# Patient Record
Sex: Male | Born: 1965 | Race: White | Hispanic: No | Marital: Married | Smoking: Never smoker
Health system: Southern US, Community
[De-identification: ages and names within clinical notes are randomized; demographics above are authoritative.]

## PROBLEM LIST (undated history)

## (undated) HISTORY — PX: TOTAL HIP ARTHROPLASTY: SHX124

---

## 2000-01-16 ENCOUNTER — Ambulatory Visit (HOSPITAL_COMMUNITY): Admission: RE | Admit: 2000-01-16 | Discharge: 2000-01-16 | Payer: Self-pay | Admitting: Orthopedic Surgery

## 2000-01-16 ENCOUNTER — Encounter: Payer: Self-pay | Admitting: Orthopedic Surgery

## 2000-06-30 ENCOUNTER — Encounter: Payer: Self-pay | Admitting: Orthopedic Surgery

## 2000-06-30 ENCOUNTER — Ambulatory Visit (HOSPITAL_COMMUNITY): Admission: RE | Admit: 2000-06-30 | Discharge: 2000-06-30 | Payer: Self-pay | Admitting: Orthopedic Surgery

## 2003-09-24 ENCOUNTER — Inpatient Hospital Stay (HOSPITAL_COMMUNITY): Admission: RE | Admit: 2003-09-24 | Discharge: 2003-09-27 | Payer: Self-pay | Admitting: Orthopedic Surgery

## 2008-06-08 ENCOUNTER — Encounter (INDEPENDENT_AMBULATORY_CARE_PROVIDER_SITE_OTHER): Payer: Self-pay | Admitting: *Deleted

## 2008-07-06 ENCOUNTER — Ambulatory Visit: Payer: Self-pay | Admitting: Internal Medicine

## 2008-07-06 DIAGNOSIS — M1990 Unspecified osteoarthritis, unspecified site: Secondary | ICD-10-CM | POA: Insufficient documentation

## 2008-07-13 ENCOUNTER — Ambulatory Visit: Payer: Self-pay | Admitting: Internal Medicine

## 2008-07-18 ENCOUNTER — Encounter (INDEPENDENT_AMBULATORY_CARE_PROVIDER_SITE_OTHER): Payer: Self-pay | Admitting: *Deleted

## 2008-07-18 LAB — CONVERTED CEMR LAB
AST: 21 units/L (ref 0–37)
BUN: 9 mg/dL (ref 6–23)
Basophils Relative: 1 % (ref 0.0–3.0)
CO2: 33 meq/L — ABNORMAL HIGH (ref 19–32)
CRP, High Sensitivity: 7 — ABNORMAL HIGH (ref 0.00–5.00)
Chloride: 102 meq/L (ref 96–112)
Creatinine, Ser: 0.8 mg/dL (ref 0.4–1.5)
Eosinophils Relative: 2.4 % (ref 0.0–5.0)
LDL Cholesterol: 120 mg/dL — ABNORMAL HIGH (ref 0–99)
Lymphocytes Relative: 44.2 % (ref 12.0–46.0)
Neutrophils Relative %: 44.9 % (ref 43.0–77.0)
RBC: 4.6 M/uL (ref 4.22–5.81)
TSH: 1.62 microintl units/mL (ref 0.35–5.50)
Total CHOL/HDL Ratio: 5.3
VLDL: 22 mg/dL (ref 0–40)
WBC: 5.1 10*3/uL (ref 4.5–10.5)

## 2008-07-20 ENCOUNTER — Telehealth (INDEPENDENT_AMBULATORY_CARE_PROVIDER_SITE_OTHER): Payer: Self-pay | Admitting: *Deleted

## 2008-09-05 ENCOUNTER — Inpatient Hospital Stay (HOSPITAL_COMMUNITY): Admission: RE | Admit: 2008-09-05 | Discharge: 2008-09-08 | Payer: Self-pay | Admitting: Orthopedic Surgery

## 2009-05-13 ENCOUNTER — Telehealth: Payer: Self-pay | Admitting: Internal Medicine

## 2009-05-30 ENCOUNTER — Encounter: Payer: Self-pay | Admitting: Internal Medicine

## 2009-05-31 ENCOUNTER — Encounter: Payer: Self-pay | Admitting: Internal Medicine

## 2009-05-31 LAB — CONVERTED CEMR LAB
BUN: 14 mg/dL
Chloride, Serum: 104 mmol/L
Cholesterol: 193 mg/dL
Creatinine, Ser: 0.9 mg/dL
Glucose, Bld: 93 mg/dL
HCT: 41.5 %
HDL: 45 mg/dL
LDL Cholesterol: 130 mg/dL
Platelets: 248 10*3/uL
Testosterone, total: 311 ng/mL
Total Bilirubin: 1.6 mg/dL
Total Protein: 7 g/dL
Triglycerides: 54 mg/dL
WBC, blood: 4.5 10*3/uL

## 2011-02-10 NOTE — Op Note (Signed)
[name], [name] NO.:  [id number]   MEDICAL RECORD NO.:  [id number]          PATIENT TYPE:  INP   LOCATION:  0001                         FACILITY:  [facility]   PHYSICIAN:  Ollen Gross, M.D.    DATE OF BIRTH:  [birth year]   DATE OF PROCEDURE:  09/05/2008  DATE OF DISCHARGE:                               OPERATIVE REPORT   PREOPERATIVE DIAGNOSIS:  Osteoarthritis left hip.   POSTOPERATIVE DIAGNOSIS:  Osteoarthritis left hip.   PROCEDURE:  Left total hip arthroplasty.   SURGEON:  Ollen Gross, M.D.   ASSISTANT:  Avel Peace PA-C   ANESTHESIA:  Spinal.   ESTIMATED BLOOD LOSS:  500 mL.   DRAINS:  Hemovac times one.   COMPLICATIONS:  None.   CONDITION:  Stable to recovery room.   BRIEF CLINICAL NOTE:  Allen Cordova is a 45 year old male who has end-stage  arthritis of the left hip with progressively worsening pain and  dysfunction.  He had a previous successful right total hip arthroplasty  and presents now for left total hip arthroplasty.   PROCEDURE IN DETAIL:  After successful administration of spinal  anesthetic, the patient was placed in the right lateral decubitus  position with the left side up and held with the hip positioner.  The  left lower extremity was isolated from his perineum with plastic drapes  and prepped and draped in the usual sterile fashion.  Short  posterolateral incision is made with a 10 blade through subcutaneous  tissue to the level of fascia lata which was incised in line with the  skin incision.  Sciatic nerve was palpated and protected and short  external rotators isolated off the femur.  Capsulectomy is performed and  the hip was dislocated.  The center of femoral head is marked and a  trial prosthesis is placed such that the center of the trial head  corresponds to the center of his native femoral head.  Osteotomy lines  marked on the femoral neck and osteotomy made with an oscillating saw.  The femoral head is removed and  femur retracted anteriorly to gain  acetabular exposure.   Acetabular retractors were placed and labrum and osteophytes removed.  Reaming starts at 47 mm coursing in increments of 2 up to 53 mm.  Then a  54-mm pinnacle acetabular shell was placed in anatomic position and  transfixed with a single dome screw.  The apex hole eliminator is placed  and the permanent 36 mm neutral Ultamet metal liner was placed for metal-  on-metal hip replacement.   The femur was prepared with the canal finder and irrigation.  Axial  reaming is performed to 13.5 mm, proximal reaming to an 18 D and the  sleeve machined to a large.  A 18 D large trial sleeve is placed with 18  x 13 stem and a 36 plus 8 neck about 10 degrees beyond native  anteversion.  The 36.0 trial head is placed.  The hip was reduced with  outstanding stability.  There is full extension, full external rotation,  70 degrees flexion, 40 degrees adduction and  90 degrees internal  rotation and 90 degrees of flexion and 70 degrees of internal rotation.  By placing the left leg on top of the right, the left leg was a tiny bit  shorter so I went to +3 head which had better soft tissue tension and  more appropriate length.  The hip was then reduced,the trials removed.  Permanent 18 D large sleeve is placed, 18 x 13 stem, 36 plus 8 neck  about 10 degrees beyond native anteversion.  A 36 plus 3 head is placed  and the hip is reduced to the same stability parameters.  The wound was  copiously irrigated with saline solution and short rotators reattached  to the femur through drill holes.  Fascia lata was closed over Hemovac  drain with interrupted #1 Vicryl, subcu closed with #1 and #2-0 Vicryl  and subcuticular running 4-0 Monocryl.  A drain is hooked to suction,  the incision cleaned and dried and Steri-Strips and bulky sterile  dressing applied.  He is then placed into a knee immobilizer, awakened  and transported to recovery in stable  condition.      Ollen Gross, M.D.  Electronically Signed     FA/MEDQ  D:  09/05/2008  T:  09/05/2008  Job:  [id number]

## 2011-02-10 NOTE — H&P (Signed)
Allen Cordova, Allen Cordova NO.:  [id number]   MEDICAL RECORD NO.:  [id number]          PATIENT TYPE:  INP   LOCATION:  NA                           FACILITY:  [facility]   PHYSICIAN:  Ollen Gross, M.D.    DATE OF BIRTH:  09-Jun-1966   DATE OF ADMISSION:  09/05/2008  DATE OF DISCHARGE:                              HISTORY & PHYSICAL   CHIEF COMPLAINT:  Left hip pain.   HISTORY OF THE PRESENT ILLNESS:  The patient is a 45 year old male well  to Dr. Ollen Gross having previously undergone a right total hip back  in December of 2004 for end-stage arthritis.  He has done extremely well  with his right hip.  The left hip has progressively gotten worse over  the past couple of years.  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  He has reached a point where he would like to have the other  side done.  The risks and benefits were discussed.  The patient was  subsequently admitted to the hospital.   ALLERGIES:  No know drug allergies.   CURRENT MEDICATIONS:  Occasional Aleve. Multiple vitamins.   PAST MEDICAL HISTORY:  Past history of osteomyelitis in the right foot  secondary to nail puncture many years ago as a teenager.  Otherwise,  medical history is negative.   PAST SURGICAL HISTORY:  Right hip arthroscopy.  Incision and drainage of  the right foot due to osteomyelitis and recent right total hip in 2004.   SOCIAL HISTORY:  Married, denies the use of tobacco products or alcohol  products.  He is a Education officer, community here in town.   FAMILY HISTORY:  Father deceased with history of myocardial infarction  and emphysema.   REVIEW OF SYSTEMS:  GENERAL:  No fevers, chills or night sweats.  NEUROLOGIC:  No seizure, syncope or paralysis.  RESPIRATORY:  No shortness of breath, productive cough, or hemoptysis.  CARDIOVASCULAR:  No chest pain, angina, orthopnea.  GI:  No nausea, vomiting or diarrhea or  constipation.  GU:  No dysuria, hematuria or discharge.  MUSCULOSKELETAL:  Right hip.   PHYSICAL EXAMINATION:  VITALS SIGNS:  Pulse 64, respirations 12, blood  pressure 116/78.  GENERAL:  In general a 45 year old white male well-nourished, well-  developed in no acute distress, alert and cooperative, excellent  sensorium.  HEENT:  Normocephalic, atraumatic.  Pupils are equal, round and  reactive.  Extraocular movements are intact.  NECK:  Neck is supple.  CHEST:  Chest is clear.  Anterior and posterior chest walls are without  rhonchi, rales or wheezing.  HEART:  Heart has a regular rate and rhythm.  No murmurs.  S1, S2 noted.  ABDOMEN:  Soft, slightly round.  Bowel sounds present.  RECTAL, BREAST AND GENITALIA:  Not done and per the present illness.  EXTREMITIES:  Left hip flexion 90, internal rotation 10, external  rotation 20, abduction 20.   IMPRESSION:  Osteoarthritis, left hip.   PLAN:  The patient admitted  to [facility] to undergo a left  total hip replacement arthroplasty.  Surgery will be performed by Dr.  Ollen Gross.      [name], P.A.C.      Ollen Gross, M.D.  Electronically Signed    ALP/MEDQ  D:  09/03/2008  T:  09/03/2008  Job:  [id number]   cc:   Ollen Gross, M.D.  Fax: [phone]

## 2011-02-13 NOTE — H&P (Signed)
Allen Cordova, Allen Cordova                            ACCOUNT NO.:  [id number]   MEDICAL RECORD NO.:  [id number]                   PATIENT TYPE:  INP   LOCATION:  0464                                 FACILITY:  [facility]   PHYSICIAN:  Ollen Gross, M.D.                 DATE OF BIRTH:  08-08-1966   DATE OF ADMISSION:  09/24/2003  DATE OF DISCHARGE:                                HISTORY & PHYSICAL   CHIEF COMPLAINT:  Right hip pain.   HISTORY OF PRESENT ILLNESS:  The patient is a 45 year old who has been seen  by Dr. Lequita Halt for ongoing right hip pain.  He has previously undergone a  hip arthroscopy in the past, but unfortunately has had significantly  increasing right hip pain and dysfunction.  He is having problems with range  of motion, and it is also limiting what he can and cannot do.  He is a  Education officer, community, has a very practice, and it has started to interfere with his  lifestyle and his career.  He was seen in the office where x-rays show bone-  on-bone changes throughout the joint with some marginal osteophyte  formation.  It is felt he has reached the point where he could benefit from  undergoing hip replacement.  Risks and benefits of this procedure have been  discussed with the patient, and he elects to proceed with surgery.   ALLERGIES:  No known drug allergies.   CURRENT MEDICATIONS:  1. Aleve b.i.d.  2. Vitamin A, E, C, and B complex.   PAST MEDICAL HISTORY:  Negative, with the exception of osteomyelitis to the  right foot secondary to a nail puncture.   PAST SURGICAL HISTORY:  Right hip arthroscopy and I&D of the right foot at  age 71 for the osteomyelitis.   SOCIAL HISTORY:  Married, three children.  Denies history of tobacco  products or alcohol products.   FAMILY HISTORY:  Father deceased with a history of MI and emphysema.   REVIEW OF SYSTEMS:  GENERAL:  No fevers, chills, night sweats.  NEUROLOGIC:  No seizures, syncope, or paralysis.  RESPIRATORY:  No shortness of  breath,  productive cough.  He has a mild cold, which has been improving.  CARDIOVASCULAR:  No chest pain, angina, or orthopnea.  GI:  No nausea,  vomiting, diarrhea, or constipation.  GU:  No dysuria or hematuria or  discharge.  MUSCULOSKELETAL:  Pertinent of the right hip found in the  history of present illness.   PHYSICAL EXAMINATION:  VITAL SIGNS:  Pulse 64, respirations 12, blood  pressure 128/78.  GENERAL:  A 45 year old white male, well-nourished, well-  developed, muscular build, in no acute distress.  Alert, oriented, and  cooperative.  HEENT:  Normocephalic and atraumatic.  Pupils are round and  reactive  Oropharynx clear.  NECK:  Supple.  CHEST:  Clear, anterior and  posterior chest walls.  No  rhonchi, rales, or wheezing.  HEART:  Regular  rate and rhythm.  No murmurs.  ABDOMEN:  Soft, nontender.  Bowel sounds were  present.  RECTAL/BREAST/GENITALIA:  Not done.  Not pertinent to present  illness.  EXTREMITIES:  Confined to the right hip.  Flexion of 90 degrees.  He does ambulate with an antalgic gain.  No internal rotation.  Only 10  degrees of external rotation.  Abduction of about 30 degrees.   IMPRESSION:  Osteoarthritis of right hip.   PLAN:  The patient will be admitted to [facility] to undergo right  total hip arthroplasty.  Surgery will be performed by Dr. Ollen Gross.     Allen Cordova, P.A.              Ollen Gross, M.D.    ALP/MEDQ  D:  09/24/2003  T:  09/25/2003  Job:  [id number]

## 2011-02-13 NOTE — Op Note (Signed)
[place]  Patient:    KAMRYN, GAUTHIER                         MRN: [id number] Proc. Date: 06/30/00 Adm. Date:  [id number] Disc. Date: [id number] Attending:  Ollen Gross V                           Operative Report  PREOPERATIVE DIAGNOSIS: Right hip intractable pain with labral and chondral tears and defects.  POSTOPERATIVE DIAGNOSIS: Right hip intractable pain with labral and chondral tears and defects.  OPERATION/PROCEDURE: Right hip arthroscopy with labral and chondral debridement.  SURGEON: Trudee Grip, M.D.  ASSISTANT: Ralene Bathe, P.A.  ANESTHESIA: General.  ESTIMATED BLOOD LOSS: Minimal.  DRAINS: None.  COMPLICATIONS: None.  CONDITION: Stable to recovery room.  INDICATIONS FOR PROCEDURE: Allen Cordova is a 45 year old male with a several year history of progressively increasing right hip pain and mechanical symptoms. X-rays show focal degenerative changes superolaterally.  MRI showed a labral tear anterosuperior and anteroinferior.  He has had intractable pain and presents now for arthroscopic debridement.  DESCRIPTION OF PROCEDURE: With successful administration of general anesthesia the patient was placed in the left lateral decubitus position with the right side up and a peroneal post was placed, which was well padded, and not impinging on the scrotum.  The right foot was then placed in a traction boot and traction applied under fluoroscopic guidance.  Once the joint was distracted approximately 1 cm the traction was locked in that position.  The hip was then prepped and draped in the usual sterile fashion.  Two cannulated spinal needles were then placed just anterior and posterior to the tip of the greater trochanter.  They were shown to enter the joint under fluoroscopic guidance.  The trocar was removed and the Nitonol wires were passed through the needles.  The needles were then taken out.  Incision was then  made posteriorly around the wire and then the 5 and then 7 mm dilator placed.  The 5 mm cannula was placed into the joint and then the camera placed.  Inflow was initiated and arthroscopic visualization proceeded.  There was some chondromalacia of the femoral head anteriorly but centrally and posteriorly looked good.  There was no evidence of any full-thickness chondral defect on the femoral head.  The trochlea appears normal.  Posteriorly there was some hypertrophy of the labrum with fraying and no evidence of any chondral defect of the acetabulum.  Centrally just above the trochlea there was grade 3 and 4 chondromalacia in a small focal area about 1 x 1 cm.  This was then shaved down to a stable base after the anterior portal was established.  Under arthroscopic guidance the dilators were placed over the anterior wire up to 7 mm and then a 5 mm cannula was placed.  The camera and shaver were switched and the posterior fraying of the labrum was debrided back to a stable base. The central defect was also debrided back to a stable cartilaginous base.  At this point the portals were again switched and the anterior aspect then visualized.  He had grade 4 changes on the anterior acetabulum in an area about 2 x 3 cm.  There was blistered cartilage and also very thin cartilage down to bone.  This was probed and found to be unstable.  The shaver was then used to debride it back to a  stable bony base.  The anterior/inferior and anterior central labrum were also torn and they were debrided.  Anterior/ superior had some fraying and that was debrided.  The bony base after debrided was about 2 x 3 cm as stated.  A Steinmann pin was used to perforate a portion of it to allow for bleeding and then the rest was abraded with the shaver.  At this point the femoral head was again inspected with only anterior chondromalacia but no full-thickness defects.  Arthroscopic equipment was removed from the posterior  portal, which was closed with Nylon.  Marcaine 0.50% 20 cc with epinephrine was injected through the inflow cannula and that was removed.  The other portal was then closed with Nylon.  Traction was removed and the patient then placed supine, awakened, and transported to the recovery room in stable condition. DD:  06/30/00 TD:  07/01/00 Job: [id number]

## 2011-02-13 NOTE — Discharge Summary (Signed)
Allen Cordova, Allen Cordova NO.:  [id number]   MEDICAL RECORD NO.:  [id number]          PATIENT TYPE:  INP   LOCATION:  1610                         FACILITY:  [facility]   PHYSICIAN:  Ollen Gross, M.D.    DATE OF BIRTH:  1966-05-30   DATE OF ADMISSION:  09/05/2008  DATE OF DISCHARGE:  09/08/2008                               DISCHARGE SUMMARY   ADMITTING DIAGNOSES:  1. Osteoarthritis, left hip.  2. Past history of osteomyelitis, right foot, secondary to nail      puncture many years ago.   DISCHARGE DIAGNOSES:  1. Osteoarthritis, left hip, status post left total replacement      arthroplasty.  2. Mild postop blood loss anemia, did not require transfusion.  3. Past history of osteomyelitis, right foot, secondary to nail      puncture many years ago.   PROCEDURE:  September 05, 2008, left total hip.   SURGEON:  Dr. Lequita Halt.   ASSISTANT:  Avel Peace, P.A.-C.   Spinal anesthesia.   CONSULTS:  None.   BRIEF HISTORY:  [name] is a 42-year male with end-stage arthritis of left  hip, progressive, worsening pain and dysfunction, successful right total  hip, now presents for left total hip.   LABORATORY DATA:  Preop CBC, hemoglobin of 14.5, hematocrit of 40.8,  white cell count 5.6, platelets 265, postop hemoglobin 11.5, down to  10.8, back up to 11.0 with a crit of 32.5.  PT/PTT preop 13.2 and 31  respectively.  INR 1.0.  Serial pro times followed.  Last noted PT/INR  22.0 and 1.8.  Chem panel on admission all within normal limits with the  exception of mildly elevated total bili of 1.3.  Serial BMETs were  followed.  Electrolytes remained within normal limits.  Glucose did go  up from 93 to 150, back down to 119.  Preop UA negative.  Blood group  type O-.   EKG, July 06, 2008, sinus rhythm, incomplete right bundle branch  block, confirmed by Dr. Drue Novel.   HOSPITAL COURSE:  Patient admitted to [facility], tolerated  procedure well, later transferred to  recovery room on orthopedic floor.  Started on PCA and p.o. analgesic pain control following surgery.  Given  24 hours postop IV antibiotics.  Started on Coumadin for DVT  prophylaxis.  Doing very well the morning of day 1, had excellent  urinary output.  Hemovac drain placed in by surgery was pulled on day 1  without difficulty.  Started getting up with therapy on day 1, walked  about 65 feet, doing extremely well by day 2.  Dressing was changed,  incision looked good.  Hemoglobin was stable at 10.8, no symptoms.  Continued to progress well with physical therapy and by day 3 met all  goals and was ready to go home.   DISCHARGE/PLAN:  1. Patient discharged home on September 08, 2008.  2. Discharge diagnoses:  Please see above.  3. Discharge meds:  Percocet, Robaxin, Coumadin.   DIET:  As tolerated.   FOLLOWUP:  Two weeks.   DISPOSITION:  Home.  CONDITION UPON DISCHARGE:  Improving.   ACTIVITY:  Partial weightbearing, 25% to 50%, to the left lower  extremity.  Hip precaution.  Total hip protocol.      [name], P.A.C.      Ollen Gross, M.D.  Electronically Signed    ALP/MEDQ  D:  10/11/2008  T:  10/11/2008  Job:  [id number]   cc:   Ollen Gross, M.D.  Fax: [phone]

## 2011-02-13 NOTE — Discharge Summary (Signed)
Allen Cordova, Allen Cordova                        ACCOUNT NO.:  [id number]   MEDICAL RECORD NO.:  [id number]                   PATIENT TYPE:  INP   LOCATION:  0464                                 FACILITY:  [facility]   PHYSICIAN:  Ollen Gross, M.D.                 DATE OF BIRTH:  [birth year]   DATE OF ADMISSION:  09/24/2003  DATE OF DISCHARGE:  09/27/2003                                 DISCHARGE SUMMARY   ADMISSION DIAGNOSIS:  Osteoarthritis right hip.   DISCHARGE DIAGNOSIS:  Osteoarthritis right hip status post right total hip  arthroplasty.   PROCEDURE:  Date of surgery September 24, 2003 right total hip arthroplasty.  Surgeon:  Ollen Gross, M.D.  Assistant:  [name], P.A.-C.  Anesthesia:  General.  Blood loss:  600 mL., Hemovac drain x1.   CONSULTATIONS:  None.   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  He has been refractory to nonoperative  management.  The pain has been interfering with his daily activities and  also his career.  He now presents for total hip arthroplasty.   LABORATORY DATA:  CBC on admission showed a hemoglobin of 13.9, hematocrit  of 40.6, white cell count 4.4, red cell count 4.5, a differential within  normal limits.  Postop H&H 11.4 and 33.2.  Last noted H&H 10.7 and 31.  PT/PTT on admission 12.8 and 31 respectively with an INR 0.9.  Serial pro  times followed.  Last noted PT/INR 19.3 and 2.  Chem panel on admission  slightly elevated glucose at 135.  Remaining chem panel all within normal  limits.  Follow up BMET sodium dropped from 140 down to 131 and is back up  to 138.  Glucose went from 135 to 114.  Calcium dropped from 9.2 to 8.2.  Urinalysis on admission preoperatively negative.  Blood group type O  negative.  Right hip films:  Right total hip prosthesis in place, no visible  fractures.  Date of x-ray September 24, 2003.   HOSPITAL COURSE:  The patient was admitted to [facility], taken to  the OR, and underwent the above-stated procedure without complication.  The  patient tolerated the procedure well, later transferred to recovery room,  and then to the orthopedic floor for continued postoperative care.  Given 24  hours of postop IV antibiotics, placed on Coumadin for 3 weeks.  PT and OT  were consulted postop to assist with gait training, ambulation, and ADLs.  He was placed touchdown weightbearing.  Labs were followed.  Hemovac placed  at the time of surgery was pulled on postop day #1 without difficulty.  He  was doing fairly well on the morning after surgery.  He did have a little  bit of congestion by the next day and we placed him on some Sudafed.  Started getting up with physical  therapy.  By day #2, PCA and IV's were  discontinued.  He was weaned over to p.o. meds.  Dressing change was  initiated and incision was healing well.  He progressed very well with  physical therapy and was actually doing a fair amount of ambulation by day  #2.  He had walked 150 feet twice by day #3.  He was doing much better.  Dr.  Lequita Halt saw him on rounds.  He was ready to go home.  Decided he could start  showering.   DISCHARGE MEDICATIONS AND PLAN:  1. The patient discharged home on September 27, 2003.  2. Discharge diagnosis please see above.  3. Discharge meds:  Percocet, Robaxin, and Coumadin.  4. Diet:  As tolerated.  5. Activity:  He may start showering, touchdown weightbearing, hip     precautions at all times, total hip protocol.   FOLLOW UP:  Two weeks from surgery.  Call the office for an appointment at  [phone].   DISPOSITION:  To home.   CONDITION ON DISCHARGE:  Improved.     Alexzandrew L. Julien Girt, P.A.              Ollen Gross, M.D.    ALP/MEDQ  D:  10/24/2003  T:  10/24/2003  Job:  [id number]

## 2011-02-13 NOTE — Op Note (Signed)
NAME:  Allen, Cordova                            ACCOUNT NO.:  [id number]   MEDICAL RECORD NO.:  [id number]                   PATIENT TYPE:  INP   LOCATION:  0002                                 FACILITY:  [facility]   PHYSICIAN:  Ollen Gross, M.D.                 DATE OF BIRTH:  1966-02-03   DATE OF PROCEDURE:  09/24/2003  DATE OF DISCHARGE:                                 OPERATIVE REPORT   PREOPERATIVE DIAGNOSIS:  Osteoarthritis, right hip.   POSTOPERATIVE DIAGNOSIS:  Osteoarthritis, right hip.   PROCEDURE:  Right total hip arthroplasty.   SURGEON:  Ollen Gross, M.D.   ASSISTANT:  Alexzandrew L. Julien Girt, P.A.   ANESTHESIA:  General.   ESTIMATED BLOOD LOSS:  600.   DRAINS:  Hemovac x1.   COMPLICATIONS:  None.   CONDITION:  Stable to recovery.   BRIEF CLINICAL NOTE:  Allen Cordova is a 45 year old male with severe end-stage  osteoarthritis of the right hip with pain refractory to nonoperative  management.  He presents now for right total hip arthroplasty.   PROCEDURE IN DETAIL:  After the successful administration of a general  anesthetic, the patient was placed in the left lateral decubitus position  with the right side up and held with the hip positioner.  The right lower  extremity was isolated from the perineum with plastic drapes and prepped and  draped in the usual sterile fashion.  A min-posterolateral incision was made  with a 10 blade through subcutaneous tissue to a level of the fascia lata,  which was incised in line with the skin incision.  The sciatic nerve was  palpated and protected and the short rotators isolated off the femur.  Capsulectomy was performed, hip dislocated, and the center of the femoral  head marked.  Trial prosthesis is placed such that the center of the trial  head corresponds to the center of his native femoral head.  Osteotomy lines  are marked on the femoral neck and osteotomy is made with an oscillating  saw.  The femur is then retracted  anteriorly to gain acetabular exposure.   The acetabular retractors are placed and osteophytes and labrum are removed.  Acetabular reaming starts at a 47, coursing in increments to a 55.  Then a  56 mm Pinnacle acetabular shell is placed in an anatomic position and  transfixed with two domed screws.  A trial 36 mm neutral liner is placed.   The femur is then prepared, first the canal finder and irrigation.  Axial  reaming is performed to 13.5 mm, proximal reaming to an 10F, and the sleeve  machined to a large.  An 10F large trial sleeve is placed and an 18 x 13  stem and a 36 standard neck.  With the standard I did not feel that there  was appropriate soft tissue tension, so I went to the 36 +8.  With the  36 +  8 he had full extension, full external rotation, 70 degrees flexion, 40  degrees adduction; in 90 degrees internal rotation, 90 degrees flexion, 70  degrees rotation.  I could also flex him up to approximately 120 without a  problem.  By resting his right leg on top of the left, his leg lengths were  equal.  The hip was then dislocated and all trials removed.   The permanent apex hole eliminator is placed into the acetabular shell, then  the 36 mm neutral metal Ultramet liner was placed.  This is a metal-on-metal  hip replacement.  The 74F large sleeve is then placed with an 18 x 13 stem  and a 36 +8 neck.  I went about 10 degrees beyond his native anteversion as  he was only about 5-10 degrees anteverted.  The 36 +0 head was placed.  The  hip is reduced with the same stability parameters.  The wound is copiously  irrigated with antibiotic solution and the short rotators reattached to the  femur through drill holes.  The fascia lata is closed over a Hemovac drain  with interrupted #1 Vicryl, subcu closed with #1 and 2-0 Vicryl, and  subcuticular running 4-0 Monocryl.  The incision is cleaned and dried and  Steri-Strips and a bulky sterile dressing is applied.  The drain is hooked   to suction.  He is placed into a knee immobilizer, awakened and transported  to recovery in stable condition.                                               Ollen Gross, M.D.    FA/MEDQ  D:  09/24/2003  T:  09/24/2003  Job:  [id number]

## 2011-07-03 LAB — BASIC METABOLIC PANEL
CO2: 28 mEq/L (ref 19–32)
CO2: 30 mEq/L (ref 19–32)
Calcium: 8.3 mg/dL — ABNORMAL LOW (ref 8.4–10.5)
Chloride: 101 mEq/L (ref 96–112)
GFR calc Af Amer: >60 mL/min (ref >60)
GFR calc Af Amer: >60 mL/min (ref >60)
GFR calc non Af Amer: >60 mL/min (ref >60)
Glucose, Bld: 150 mg/dL — ABNORMAL HIGH (ref 70–99)
Potassium: 3.6 mEq/L (ref 3.5–5.1)
Potassium: 4 mEq/L (ref 3.5–5.1)
Sodium: 136 mEq/L (ref 135–145)
Sodium: 139 mEq/L (ref 135–145)

## 2011-07-03 LAB — URINALYSIS, ROUTINE W REFLEX MICROSCOPIC
Glucose, UA: NEGATIVE mg/dL
Nitrite: NEGATIVE
Specific Gravity, Urine: 1.014 (ref 1.005–1.030)
pH: 5.5 (ref 5.0–8.0)

## 2011-07-03 LAB — CBC
HCT: 31.4 % — ABNORMAL LOW (ref 39.0–52.0)
HCT: 32.5 % — ABNORMAL LOW (ref 39.0–52.0)
HCT: 34 % — ABNORMAL LOW (ref 39.0–52.0)
HCT: 42.8 % (ref 39.0–52.0)
Hemoglobin: 10.8 g/dL — ABNORMAL LOW (ref 13.0–17.0)
Hemoglobin: 11 g/dL — ABNORMAL LOW (ref 13.0–17.0)
Hemoglobin: 11.5 g/dL — ABNORMAL LOW (ref 13.0–17.0)
Hemoglobin: 14.5 g/dL (ref 13.0–17.0)
MCHC: 34 g/dL (ref 30.0–36.0)
MCHC: 34.4 g/dL (ref 30.0–36.0)
MCV: 89.8 fL (ref 78.0–100.0)
MCV: 90.9 fL (ref 78.0–100.0)
RBC: 3.45 MIL/uL — ABNORMAL LOW (ref 4.22–5.81)
RBC: 3.61 MIL/uL — ABNORMAL LOW (ref 4.22–5.81)
RBC: 3.77 MIL/uL — ABNORMAL LOW (ref 4.22–5.81)
RBC: 4.77 MIL/uL (ref 4.22–5.81)
RDW: 13.3 % (ref 11.5–15.5)
RDW: 13.3 % (ref 11.5–15.5)
WBC: 5.6 10*3/uL (ref 4.0–10.5)
WBC: 7.9 10*3/uL (ref 4.0–10.5)

## 2011-07-03 LAB — PROTIME-INR
INR: 1 (ref 0.00–1.49)
INR: 1.2 (ref 0.00–1.49)
INR: 1.8 — ABNORMAL HIGH (ref 0.00–1.49)
Prothrombin Time: 13.2 seconds (ref 11.6–15.2)
Prothrombin Time: 22.6 seconds — ABNORMAL HIGH (ref 11.6–15.2)

## 2011-07-03 LAB — COMPREHENSIVE METABOLIC PANEL
ALT: 26 U/L (ref 0–53)
Alkaline Phosphatase: 64 U/L (ref 39–117)
BUN: 15 mg/dL (ref 6–23)
Chloride: 105 mEq/L (ref 96–112)
Glucose, Bld: 93 mg/dL (ref 70–99)
Potassium: 4.1 mEq/L (ref 3.5–5.1)
Sodium: 142 mEq/L (ref 135–145)
Total Bilirubin: 1.3 mg/dL — ABNORMAL HIGH (ref 0.3–1.2)

## 2011-07-03 LAB — TYPE AND SCREEN: Antibody Screen: NEGATIVE

## 2015-04-05 ENCOUNTER — Encounter: Payer: Self-pay | Admitting: Internal Medicine

## 2015-04-05 ENCOUNTER — Ambulatory Visit (INDEPENDENT_AMBULATORY_CARE_PROVIDER_SITE_OTHER): Payer: BLUE CROSS/BLUE SHIELD | Admitting: Internal Medicine

## 2015-04-05 DIAGNOSIS — Z23 Encounter for immunization: Secondary | ICD-10-CM

## 2015-04-05 DIAGNOSIS — Z789 Other specified health status: Secondary | ICD-10-CM

## 2015-04-05 DIAGNOSIS — Z7189 Other specified counseling: Secondary | ICD-10-CM

## 2015-04-05 DIAGNOSIS — Z7184 Encounter for health counseling related to travel: Secondary | ICD-10-CM

## 2015-04-05 MED ORDER — ACETAZOLAMIDE 125 MG PO TABS: 125.0000 mg | ORAL_TABLET | Freq: Two times a day (BID) | ORAL | Status: DC

## 2015-04-05 MED ORDER — CIPROFLOXACIN HCL 500 MG PO TABS: 500.0000 mg | ORAL_TABLET | Freq: Two times a day (BID) | ORAL | Status: DC

## 2015-04-05 MED ORDER — TYPHOID VACCINE PO CPDR: 1.0000 | DELAYED_RELEASE_CAPSULE | ORAL | Status: DC

## 2015-04-05 NOTE — Progress Notes (Signed)
Subjective:   Raphael GibneyJohn S Bueno is a 49 y.o. male who presents to the Infectious Disease clinic for travel consultation. Planned departure date: April 26, 2015          Planned return date: 7 days Countries of travel: [place] Areas in country: urban   Accommodations: hotel Purpose of travel: vacation Prior travel out of [place]: yes     Objective:   Medications:reviewed    Assessment:    No contraindications to travel. none     Plan:    Issues discussed: altitude illness, insect-borne illnesses, malaria, MVA safety, rabies, safe food/water, traveler's diarrhea, website/handouts for more information, what to do if ill upon return, what to do if ill while there and Yellow Fever. Immunizations recommended: Typhoid (oral).  Discussed hepatitis A and Td and he is going to check if needs or not.  Malaria prophylaxis: not indicated Traveler's diarrhea prophylaxis: ciprofloxacin. Total duration of visit: 1 Hour. Total time spent on education, counseling, coordination of care: 30 Minutes.

## 2015-04-10 ENCOUNTER — Ambulatory Visit: Payer: BLUE CROSS/BLUE SHIELD | Admitting: *Deleted

## 2015-04-10 DIAGNOSIS — Z23 Encounter for immunization: Secondary | ICD-10-CM

## 2015-04-10 NOTE — Patient Instructions (Signed)
Return in 6 months for Hep A #2.

## 2015-04-12 ENCOUNTER — Ambulatory Visit: Payer: BLUE CROSS/BLUE SHIELD

## 2019-10-26 ENCOUNTER — Ambulatory Visit: Payer: BC Managed Care – PPO | Attending: Internal Medicine

## 2019-10-27 ENCOUNTER — Ambulatory Visit: Payer: BC Managed Care – PPO | Attending: Internal Medicine

## 2019-10-27 DIAGNOSIS — Z20822 Contact with and (suspected) exposure to covid-19: Secondary | ICD-10-CM

## 2019-10-28 LAB — NOVEL CORONAVIRUS, NAA: SARS-CoV-2, NAA: NOT DETECTED

## 2021-02-13 ENCOUNTER — Encounter (HOSPITAL_COMMUNITY): Payer: Self-pay | Admitting: Emergency Medicine

## 2021-02-13 ENCOUNTER — Emergency Department (HOSPITAL_COMMUNITY): Payer: BC Managed Care – PPO

## 2021-02-13 ENCOUNTER — Emergency Department (HOSPITAL_COMMUNITY)
Admission: EM | Admit: 2021-02-13 | Discharge: 2021-02-13 | Disposition: A | Discharge status: Home / Self Care | Payer: BC Managed Care – PPO | Attending: Emergency Medicine | Admitting: Emergency Medicine

## 2021-02-13 DIAGNOSIS — S0993XA Unspecified injury of face, initial encounter: Secondary | ICD-10-CM | POA: Diagnosis present

## 2021-02-13 DIAGNOSIS — X58XXXA Exposure to other specified factors, initial encounter: Secondary | ICD-10-CM | POA: Diagnosis not present

## 2021-02-13 DIAGNOSIS — R569 Unspecified convulsions: Secondary | ICD-10-CM | POA: Diagnosis not present

## 2021-02-13 DIAGNOSIS — S0081XA Abrasion of other part of head, initial encounter: Secondary | ICD-10-CM | POA: Diagnosis not present

## 2021-02-13 LAB — COMPREHENSIVE METABOLIC PANEL
ALT: 29 U/L (ref 0–44)
AST: 41 U/L (ref 15–41)
Albumin: 3.5 g/dL (ref 3.5–5.0)
Alkaline Phosphatase: 45 U/L (ref 38–126)
Anion gap: 6 (ref 5–15)
BUN: 11 mg/dL (ref 6–20)
CO2: 29 mmol/L (ref 22–32)
Calcium: 8.8 mg/dL — ABNORMAL LOW (ref 8.9–10.3)
Chloride: 100 mmol/L (ref 98–111)
Creatinine, Ser: 1.13 mg/dL (ref 0.61–1.24)
GFR, Estimated: >60 mL/min (ref >60)
Glucose, Bld: 146 mg/dL — ABNORMAL HIGH (ref 70–99)
Potassium: 4.3 mmol/L (ref 3.5–5.1)
Sodium: 135 mmol/L (ref 135–145)
Total Bilirubin: 2 mg/dL — ABNORMAL HIGH (ref 0.3–1.2)
Total Protein: 6.2 g/dL — ABNORMAL LOW (ref 6.5–8.1)

## 2021-02-13 LAB — CBC WITH DIFFERENTIAL/PLATELET
Abs Immature Granulocytes: 0.03 10*3/uL (ref 0.00–0.07)
Basophils Absolute: 0 10*3/uL (ref 0.0–0.1)
Basophils Relative: 0 %
Eosinophils Absolute: 0.2 10*3/uL (ref 0.0–0.5)
Eosinophils Relative: 4 %
HCT: 49.7 % (ref 39.0–52.0)
Hemoglobin: 16.5 g/dL (ref 13.0–17.0)
Immature Granulocytes: 1 %
Lymphocytes Relative: 35 %
Lymphs Abs: 1.8 10*3/uL (ref 0.7–4.0)
MCH: 31.3 pg (ref 26.0–34.0)
MCHC: 33.2 g/dL (ref 30.0–36.0)
MCV: 94.3 fL (ref 80.0–100.0)
Monocytes Absolute: 0.5 10*3/uL (ref 0.1–1.0)
Monocytes Relative: 9 %
Neutro Abs: 2.7 10*3/uL (ref 1.7–7.7)
Neutrophils Relative %: 51 %
Platelets: 234 10*3/uL (ref 150–400)
RBC: 5.27 MIL/uL (ref 4.22–5.81)
RDW: 14.1 % (ref 11.5–15.5)
WBC: 5.3 10*3/uL (ref 4.0–10.5)
nRBC: 0 % (ref 0.0–0.2)

## 2021-02-13 LAB — URINALYSIS, COMPLETE (UACMP) WITH MICROSCOPIC
Bilirubin Urine: NEGATIVE
Glucose, UA: NEGATIVE mg/dL
Hgb urine dipstick: NEGATIVE
Ketones, ur: NEGATIVE mg/dL
Leukocytes,Ua: NEGATIVE
Nitrite: NEGATIVE
Protein, ur: 30 mg/dL — AB
Specific Gravity, Urine: 1.016 (ref 1.005–1.030)
pH: 8 (ref 5.0–8.0)

## 2021-02-13 LAB — RAPID URINE DRUG SCREEN, HOSP PERFORMED
Amphetamines: NOT DETECTED
Barbiturates: NOT DETECTED
Benzodiazepines: NOT DETECTED
Cocaine: NOT DETECTED
Opiates: NOT DETECTED
Tetrahydrocannabinol: NOT DETECTED

## 2021-02-13 LAB — ETHANOL: Alcohol, Ethyl (B): <10 mg/dL (ref <10)

## 2021-02-13 LAB — MAGNESIUM: Magnesium: 1.8 mg/dL (ref 1.7–2.4)

## 2021-02-13 LAB — CBG MONITORING, ED: Glucose-Capillary: 131 mg/dL — ABNORMAL HIGH (ref 70–99)

## 2021-02-13 MED ORDER — IBUPROFEN 400 MG PO TABS
400.0000 mg | ORAL_TABLET | Freq: Once | ORAL | Status: AC
  Administered 2021-02-13: 400 mg via ORAL
  Filled 2021-02-13: qty 1

## 2021-02-13 MED ORDER — SODIUM CHLORIDE 0.9 % IV BOLUS
1000.0000 mL | Freq: Once | INTRAVENOUS | Status: AC
  Administered 2021-02-13: 1000 mL via INTRAVENOUS

## 2021-02-13 NOTE — ED Triage Notes (Signed)
Pt arrived via ems due to having a full body seizure at 4:45 am. EMS reports pts seizure lasted 2 minutes. According to ems pt was post ictal on arrival. Pt is axox4. Pts only complaint at this time is soreness. EMS reports pt had no medical history, no seizures. Pts vitals have been stable according to ems.

## 2021-02-13 NOTE — ED Notes (Signed)
Patient verbalizes understanding of discharge instructions. Opportunity for questioning and answers were provided. Armband removed by staff, pt discharged from ED and ambulated to lobby to return home with family.  

## 2021-02-13 NOTE — ED Notes (Signed)
Pt c/o of increase back pain. PA Morrelli notified.

## 2021-02-13 NOTE — Discharge Instructions (Addendum)
At this time there does not appear to be the presence of an emergent medical condition, however there is always the potential for conditions to change. Please read and follow the below instructions.  Please return to the Emergency Department immediately for any new or worsening symptoms or if you have another seizure-like event. Please be sure to follow up with your Primary Care Provider within one week regarding your visit today; please call their office to schedule an appointment even if you are feeling better for a follow-up visit. Please call either [facility] or [place] neurology to schedule follow-up appointments for further investigation into your seizure-like activity. As we discussed please have your lab work rechecked by your primary care provider and neurologist, your blood sugar level was slightly elevated today, your calcium and protein levels in your blood were slightly low, your bilirubin level was slightly high and you had protein in your urine as well.  Please be sure to have these rechecked. As we discussed you cannot drive or perform any other dangerous activities such as climbing ladders or swimming until you are evaluated and cleared by your neurologist.  Go to the nearest Emergency Department immediately if: You have fever or chills You have any of these problems: A seizure that lasts longer than 5 minutes. Many seizures in a row and you do not feel better between seizures. A seizure that makes it harder to breathe. A seizure and you can no longer speak or use part of your body. You do not wake up right after a seizure. You get hurt during a seizure. You have any new/concerning or worsening of symptoms. You feel confused or have pain right after a seizure. These symptoms may be an emergency. Get help right away. Call your local emergency services (911 in the [place] Do not wait to see if the symptoms will go away. Do not drive yourself to the hospital.   Please read the  additional information packets attached to your discharge summary.  Do not take your medicine if  develop an itchy rash, swelling in your mouth or lips, or difficulty breathing; call 911 and seek immediate emergency medical attention if this occurs.  You may review your lab tests and imaging results in their entirety on your MyChart account.  Please discuss all results of fully with your primary care provider and other specialist at your follow-up visit.  Note: Portions of this text may have been transcribed using voice recognition software. Every effort was made to ensure accuracy; however, inadvertent computerized transcription errors may still be present.

## 2021-02-13 NOTE — ED Provider Notes (Signed)
[facility] EMERGENCY DEPARTMENT Provider Note   CSN: [id number] Arrival date & time: 02/13/21  0600     History Chief Complaint  Patient presents with  . Seizures    Allen Cordova is a 55 y.o. male otherwise healthy male presents today following seizure-like activity.  Around 4:40 AM this morning patient's wife woke up and noticed patient was having a seizure she reports the patient was extended shaking his entire body, this lasted around 2 minutes.  She called EMS, after activity subsided she noted that the patient was very confused but this has gradually improved.  Patient was wearing a sleep apnea mouthpiece reports a small abrasion to the cheek no other injuries.  No incontinence.  Patient has since fully regained mental status.  Patient denies any history of seizures.  Patient reports that he is generally sore at this time no other complaints  Patient reports that he returned home yesterday from a long golf trip with his friends.  Patient reports that he never drinks alcohol but on this trip he did drink alcohol, last drink was Tuesday night 02/11/2021.  Patient denies any drug use.    Denies fever/chills, fall/injury, headache, vision change, numbness/tingling, weakness, chest pain, nausea vomiting diarrhea or any additional concerns  HPI     History reviewed. No pertinent past medical history.  Patient Active Problem List   Diagnosis Date Noted  . Travel advice encounter 04/05/2015  . OSTEOARTHRITIS 07/06/2008    History reviewed. No pertinent surgical history.     History reviewed. No pertinent family history.     Home Medications Prior to Admission medications   Medication Sig Start Date End Date Taking? Authorizing Provider  acetaZOLAMIDE (DIAMOX) 125 MG tablet Take 1 tablet (125 mg total) by mouth 2 (two) times daily. Start 2 days prior to ascent above 9,000 feet and continue for 2-3 days until acclimated or at descent 04/05/15   Comer, Belia Heman,  MD  ciprofloxacin (CIPRO) 500 MG tablet Take 1 tablet (500 mg total) by mouth 2 (two) times daily. Take 2-3 days until diarrhea resolves 04/05/15   Gardiner Barefoot, MD  typhoid (VIVOTIF) DR capsule Take 1 capsule by mouth every other day. Keep refrigerated 04/05/15   Comer, Belia Heman, MD    Allergies    Patient has no allergy information on record.  Review of Systems   Review of Systems Ten systems are reviewed and are negative for acute change except as noted in the HPI  Physical Exam Updated Vital Signs BP 132/84   Pulse 74   Temp 98.2 F (36.8 C) (Oral)   Resp (!) 22   Ht 5\' 9"  (1.753 m)   Wt 91.6 kg   SpO2 95%   BMI 29.83 kg/m   Physical Exam Constitutional:      General: He is not in acute distress.    Appearance: Normal appearance. He is well-developed. He is not ill-appearing or diaphoretic.  HENT:     Head: Normocephalic and atraumatic.  Eyes:     General: Vision grossly intact. Gaze aligned appropriately.     Pupils: Pupils are equal, round, and reactive to light.  Neck:     Trachea: Trachea and phonation normal.  Cardiovascular:     Rate and Rhythm: Normal rate and regular rhythm.     Pulses: Normal pulses.          Dorsalis pedis pulses are 2+ on the right side and 2+ on the left side.  Heart sounds: Normal heart sounds.  Pulmonary:     Effort: Pulmonary effort is normal. No respiratory distress.     Breath sounds: Normal breath sounds.  Abdominal:     General: There is no distension.     Palpations: Abdomen is soft. There is no pulsatile mass.     Tenderness: There is no abdominal tenderness. There is no guarding or rebound.  Musculoskeletal:        General: Normal range of motion.     Cervical back: Normal range of motion.     Comments: No midline C/T/L spinal tenderness to palpation, no deformity, crepitus, or step-off noted. No sign of injury to the neck or back.   Feet:     Right foot:     Protective Sensation: 3 sites tested. 3 sites sensed.      Left foot:     Protective Sensation: 3 sites tested. 3 sites sensed.  Skin:    General: Skin is warm and dry.  Neurological:     Mental Status: He is alert.     GCS: GCS eye subscore is 4. GCS verbal subscore is 5. GCS motor subscore is 6.     Comments: Mental Status: Alert, oriented, thought content appropriate, able to give a coherent history. Speech fluent without evidence of aphasia. Able to follow 2 step commands without difficulty. Cranial Nerves: II: Peripheral visual fields grossly normal, pupils equal, round, reactive to light III,IV, VI: ptosis not present, extra-ocular motions intact bilaterally V,VII: smile symmetric, eyebrows raise symmetric, facial light touch sensation equal VIII: hearing grossly normal to voice X: uvula elevates symmetrically XI: bilateral shoulder shrug symmetric and strong XII: midline tongue extension without fassiculations Motor: Normal tone. 5/5 strength in upper and lower extremities bilaterally including strong and equal grip strength and dorsiflexion/plantar flexion Sensory: Sensation intact to light touch in all extremities.Negative Romberg.  Cerebellar: normal finger-to-nose with bilateral upper extremities. Normal heel-to -shin balance bilaterally of the lower extremity. No pronator drift.  Gait: normal gait and balance CV: distal pulses palpable throughout No asterixis, no clonus of the feet  Psychiatric:        Behavior: Behavior normal.     ED Results / Procedures / Treatments   Labs (all labs ordered are listed, but only abnormal results are displayed) Labs Reviewed  COMPREHENSIVE METABOLIC PANEL - Abnormal; Notable for the following components:      Result Value   Glucose, Bld 146 (*)    Calcium 8.8 (*)    Total Protein 6.2 (*)    Total Bilirubin 2.0 (*)    All other components within normal limits  URINALYSIS, COMPLETE (UACMP) WITH MICROSCOPIC - Abnormal; Notable for the following components:   Protein, ur 30 (*)     Bacteria, UA RARE (*)    All other components within normal limits  CBG MONITORING, ED - Abnormal; Notable for the following components:   Glucose-Capillary 131 (*)    All other components within normal limits  CBC WITH DIFFERENTIAL/PLATELET  RAPID URINE DRUG SCREEN, HOSP PERFORMED  ETHANOL  MAGNESIUM    EKG EKG Interpretation  Date/Time:  Thursday Feb 13 2021 06:04:02 EDT Ventricular Rate:  84 PR Interval:  153 QRS Duration: 110 QT Interval:  399 QTC Calculation: 472 R Axis:   135 Text Interpretation: Sinus rhythm Right axis deviation No old tracing to compare Confirmed by Linwood DibblesKnapp, Jon [id number]) on 02/13/2021 7:12:21 AM   Radiology CT Head Wo Contrast  Result Date: 02/13/2021 CLINICAL DATA:  Facial trauma,  seizure EXAM: CT HEAD WITHOUT CONTRAST TECHNIQUE: Contiguous axial images were obtained from the base of the skull through the vertex without intravenous contrast. COMPARISON:  None. FINDINGS: Brain: Normal anatomic configuration. No abnormal intra or extra-axial mass lesion or fluid collection. No abnormal mass effect or midline shift. No evidence of acute intracranial hemorrhage or infarct. Ventricular size is normal. Cerebellum unremarkable. Vascular: Unremarkable Skull: Intact Sinuses/Orbits: Paranasal sinuses are clear. Orbits are unremarkable. Other: Mastoid air cells and middle ear cavities are clear. IMPRESSION: No acute intracranial abnormality.  No calvarial fracture. Electronically Signed   By: Helyn Numbers MD   On: 02/13/2021 06:58    Procedures Procedures   Medications Ordered in ED Medications  sodium chloride 0.9 % bolus 1,000 mL (0 mLs Intravenous Stopped 02/13/21 0954)  ibuprofen (ADVIL) tablet 400 mg (400 mg Oral Given 02/13/21 2725)    ED Course  I have reviewed the triage vital signs and the nursing notes.  Pertinent labs & imaging results that were available during my care of the patient were reviewed by me and considered in my medical decision making (see  chart for details).  Clinical Course as of 02/13/21 1022  Thu Feb 13, 2021  3664 Dr. Wilford Corner [BM]    Clinical Course User Index [BM] Elizabeth Palau   MDM Rules/Calculators/A&P                         Additional history obtained from: 1. Nursing notes from this visit. 2. Family, wife at bedside. 3. Review of electronic medical records. ------------------------- 55 year old otherwise healthy male presented for evaluation of seizure-like activity lasted around 2 minutes and witnessed by his wife.  Patient reports that he returned yesterday from a long golf trip with friends, he reports he normally never drinks alcohol but did drink quite a bit of alcohol on this trip his last drink was on Tuesday afternoon.  He denies any preceding alcohol withdrawal type symptoms.  He reports he was feeling somewhat dehydrated yesterday but otherwise well.  Patient was in bed during his seizure, EMS was called, no fall or injury.  Patient reports generalized muscle aches of his extremities and low back since the incident which he reports as aching cramps. ----- I ordered, reviewed and interpreted labs which include: Ethanol level negative, patient does not appear to be in withdrawal. UDS negative.   Magnesium level within normal limits. CMP shows no emergent electrolyte derangement, AKI, LFT elevations or gap.  Total bilirubin slightly elevated from prior at 2.0. Urinalysis shows 30 protein, rare bacteria, no evidence for UTI. CBC shows no leukocytosis, anemia or thrombocytopenia.  EKG: Sinus rhythm Right axis deviation No old tracing to compare Confirmed by Linwood Dibbles [phone]) on 02/13/2021 7:12:21 AM  CT Head:    IMPRESSION:  No acute intracranial abnormality. No calvarial fracture.  - 9:46 AM: Consult with neurologist Dr. Jerrell Belfast, advises outpatient neurology follow-up is appropriate, no indication for antiepileptics at this time. - Informed by RN that patient reports increasing muscle aches  and low back pain.  Patient reevaluated he reports cramping low back pain is now improving with standing and with his wife massaging his back.  On exam he is well-appearing no acute distress.  Cranial nerves intact, no meningeal signs, cardiopulmonary exam unremarkable.  Abdomen soft nontender, specifically no pulsatile masses.  He is neurovascular intact to all 4 extremities.  No chest pain shortness of breath.  Low suspicion for dissection, AAA, spinal epidural abscess,  cauda equina or other emergent causes of patient's body aches/back pain suspect this may be secondary to seizure-like activity today.  I had a long discussion with patient and his wife regarding his labs and imaging findings and they stated understanding.  Patient feels this symptoms may be due to his activities over the weekend, patient does not appear to be in alcohol withdrawal and denies any symptoms suggestive of alcohol withdrawal.  Do not feel Librium taper is indicated at this time.  Patient is requesting discharge and will follow up with urology he was given referrals to both [facility] and [facility] neurology and encouraged to call their offices today to schedule follow-up appointments.  I discussed seizure precautions with the patient and he states understanding that he is not to drive, climb ladders, swim or perform any other potentially dangerous activities until he is cleared by a neurologist.  No indication for antiepileptics at this point.  Patient's wife at bedside will be transporting patient home.  Case was discussed with attending ED physician Dr. Lynelle Doctor, agrees with discharge and outpatient neurology follow-up.  At this time there does not appear to be any evidence of an acute emergency medical condition and the patient appears stable for discharge with appropriate outpatient follow up. Diagnosis was discussed with patient who verbalizes understanding of care plan and is agreeable to discharge. I have discussed return  precautions with patient who verbalizes understanding. Patient encouraged to follow-up with their PCP. All questions answered.  Note: Portions of this report may have been transcribed using voice recognition software. Every effort was made to ensure accuracy; however, inadvertent computerized transcription errors may still be present. Final Clinical Impression(s) / ED Diagnoses Final diagnoses:  Seizure-like activity [facility])    Rx / DC Orders ED Discharge Orders    None       Elizabeth Palau 02/13/21 1022    Linwood Dibbles, MD 02/14/21 [phone]

## 2021-02-17 ENCOUNTER — Ambulatory Visit: Payer: BC Managed Care – PPO | Admitting: Neurology

## 2021-02-17 ENCOUNTER — Encounter: Payer: Self-pay | Admitting: Neurology

## 2021-02-17 ENCOUNTER — Ambulatory Visit: Payer: Self-pay | Admitting: Neurology

## 2021-02-17 VITALS — BP 130/80 | HR 78 | Ht 69.0 in | Wt 205.6 lb

## 2021-02-17 DIAGNOSIS — G40909 Epilepsy, unspecified, not intractable, without status epilepticus: Secondary | ICD-10-CM | POA: Diagnosis not present
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
## 2021-02-18 ENCOUNTER — Ambulatory Visit: Payer: BC Managed Care – PPO

## 2021-02-18 ENCOUNTER — Telehealth: Payer: Self-pay | Admitting: Neurology

## 2021-02-18 ENCOUNTER — Other Ambulatory Visit: Payer: Self-pay

## 2021-02-18 DIAGNOSIS — G40909 Epilepsy, unspecified, not intractable, without status epilepticus: Secondary | ICD-10-CM

## 2021-02-18 MED ORDER — GADOBENATE DIMEGLUMINE 529 MG/ML IV SOLN
20.0000 mL | Freq: Once | INTRAVENOUS | Status: AC | PRN
  Administered 2021-02-18: 20 mL via INTRAVENOUS

## 2021-02-18 NOTE — Telephone Encounter (Signed)
LVM for pt to call back to schedule [facility] 5/24 60 mins [employer] auth: [id number] exp. 02/18/21-08/16/21

## 2021-02-19 ENCOUNTER — Telehealth: Payer: Self-pay | Admitting: Neurology

## 2021-02-19 NOTE — Telephone Encounter (Signed)
MRI of the brain is unremarkable, I discussed this with the patient.  EEG is pending.   MRI brain 02/18/21:   IMPRESSION:   Normal MRI brain (with and without).

## 2021-03-03 ENCOUNTER — Telehealth: Payer: Self-pay | Admitting: Neurology

## 2021-03-03 ENCOUNTER — Ambulatory Visit: Payer: BC Managed Care – PPO | Admitting: Neurology

## 2021-03-03 DIAGNOSIS — G40909 Epilepsy, unspecified, not intractable, without status epilepticus: Secondary | ICD-10-CM | POA: Diagnosis not present

## 2021-03-03 NOTE — Telephone Encounter (Signed)
I called the patient.  EEG evaluation was unremarkable, we will watch the patient conservatively.  MRI of the brain was unremarkable.  He will let me know if he has another seizure event.

## 2021-03-03 NOTE — Procedures (Signed)
    History:  Allen Cordova is a 55 year old gentleman with a history of a witnessed seizure coming out of sleep that occurred on 13 Feb 2021.  The patient had episode of stiffening lasted about 2 minutes associated with elevation of the arms above the head and eye opening.  The patient has never had similar events.  He is being evaluated for the seizure.  This is a sleep deprived EEG.  No skull defects are noted.  Medications include Diamox, vitamin D, and B complex vitamins.  EEG classification: Normal awake and asleep  Description of the recording: The background rhythms of this recording consists of a fairly well modulated medium amplitude background activity of 11 Hz. As the record progresses, the patient initially is in the waking state, but appears to enter the early stage II sleep during the recording, with rudimentary sleep spindles and vertex sharp wave activity seen. During the wakeful state, photic stimulation is performed, and this results in a bilateral and symmetric photic driving response. Hyperventilation was also performed, and this results in a minimal buildup of the background rhythm activities without significant slowing seen. At no time during the recording does there appear to be evidence of spike or spike wave discharges or evidence of focal slowing. EKG monitor shows no evidence of cardiac rhythm abnormalities with a heart rate of 72.  Impression: This is a normal EEG recording in the waking and sleeping state. No evidence of ictal or interictal discharges were seen at any time during the recording.

## 2021-08-25 ENCOUNTER — Telehealth: Payer: Self-pay | Admitting: Neurology

## 2021-08-25 NOTE — Telephone Encounter (Signed)
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 I can do it on November 15th or 22nd, the whole afternoon is free for me. \  Nicole: Can you do it either days? Easy bilat CTS  Megan, would this be ok ?

## 2021-08-25 NOTE — Telephone Encounter (Signed)
12/01 appt cx due to NP Sarah out, LVM & sent mychart msg requesting pt cb to r/s.

## 2021-08-26 NOTE — Telephone Encounter (Signed)
Patient has been added to the schedule on 08/28/21, ok per Dr. Lucia Gaskins and Dr. Marjory Lies.

## 2021-08-28 ENCOUNTER — Ambulatory Visit (INDEPENDENT_AMBULATORY_CARE_PROVIDER_SITE_OTHER): Payer: BC Managed Care – PPO | Admitting: Neurology

## 2021-08-28 ENCOUNTER — Encounter: Payer: Self-pay | Admitting: Neurology

## 2021-08-28 ENCOUNTER — Ambulatory Visit: Payer: BC Managed Care – PPO | Admitting: Neurology

## 2021-08-28 ENCOUNTER — Other Ambulatory Visit: Payer: Self-pay

## 2021-08-28 DIAGNOSIS — Z0289 Encounter for other administrative examinations: Secondary | ICD-10-CM

## 2021-08-28 DIAGNOSIS — G5603 Carpal tunnel syndrome, bilateral upper limbs: Secondary | ICD-10-CM

## 2021-08-31 NOTE — Procedures (Signed)
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  The right median APB motor nerve showed prolonged distal onset latency (4.9 ms, N<4.4).   The left  median APB motor nerve showed borderline distal onset latency (4.4 ms, N<4.4).  The right Median 2nd Digit orthodromic sensory nerve showed prolonged distal peak latency 4.5 ms, N<3.4) and reduced amplitude(3 uV, N>10)  The left  Median 2nd Digit orthodromic sensory nerve showed prolonged distal peak latency 4.1 ms, N<3.4) and borderline amplitude(10 uV, N>10)  The right median/ulnar (palm) comparison nerve showed prolonged distal peak latency (Median Palm, 3.4 ms, N<2.2) and abnormal peak latency difference (Median Palm-Ulnar Palm, 1.4 ms, N<0.4) with a relative median delay.    The left median/ulnar (palm) comparison nerve showed prolonged distal peak latency (Median Palm, 2.8 ms, N<2.2) and abnormal peak latency difference (Median Palm-Ulnar Palm, 0.7 ms, N<0.4) with a relative median delay.    All remaining nerves (as indicated in the following tables) were within normal limits.  All muscles (as indicated in the following tables) were within normal limits.     Conclusion:  There is electrophysiologic evidence of right moderately-severe Carpal Tunnel Syndrome.  There is electrophysiologic evidence of left mild to moderate Carpal Tunnel Syndrome.   No suggestion of polyneuropathy or radiculopathy.  Patient is requesting surgical release, he is a Education officer, community in the area. 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 Orders Placed This Encounter  Procedures   Ambulatory referral to Orthopedic Surgery    ------------------------------- Naomie Dean,  M.D.  [facility] [address] [place], [place] [address] Tel: [phone] Fax: [phone]  Verbal informed consent was obtained from the patient, patient was informed of potential risk of procedure, including bruising, bleeding, hematoma formation, infection, muscle weakness, muscle pain, numbness, among others.        MNC    Nerve / Sites Muscle Latency Ref. Amplitude Ref. Rel Amp Segments Distance Velocity Ref. Area    ms ms mV mV %  cm m/s m/s mVms  L Median - APB     Wrist APB 4.4 ?4.4 5.4 ?4.0 100 Wrist - APB 7   21.8     Upper arm APB 8.6  5.0  92.4 Upper arm - Wrist 21 49 ?49 21.5  R Median - APB     Wrist APB 4.9 ?4.4 7.8 ?4.0 100 Wrist - APB 7   28.8     Upper arm APB 9.0  8.6  110 Upper arm - Wrist 22 54 ?49 31.0  L Ulnar - ADM     Wrist ADM 2.9 ?3.3 10.3 ?6.0 100 Wrist - ADM 7   35.4     B.Elbow ADM 6.9  8.1  78 B.Elbow - Wrist 21 53 ?49 27.9     A.Elbow ADM 8.8  8.4  104 A.Elbow - B.Elbow 10 52 ?49 30.0  R Ulnar - ADM     Wrist ADM 2.8 ?3.3 8.9 ?6.0 100 Wrist - ADM 7   26.4     B.Elbow ADM 5.9  6.9  77.3 B.Elbow - Wrist 20 64 ?49 20.6     A.Elbow ADM 7.5  8.2  119 A.Elbow - B.Elbow 10 64 ?49 25.6  SNC    Nerve / Sites Rec. Site Peak Lat Ref.  Amp Ref. Segments Distance Peak Diff Ref.    ms ms V V  cm ms ms  L Median, Ulnar - Transcarpal comparison     Median Palm Wrist 2.8 ?2.2 47 ?35 Median Palm - Wrist 8       Ulnar Palm Wrist 2.1 ?2.2 22 ?12 Ulnar Palm - Wrist 8          Median Palm - Ulnar Palm  0.7 ?0.4  R Median, Ulnar - Transcarpal comparison     Median Palm Wrist 3.4 ?2.2 11 ?35 Median Palm - Wrist 8       Ulnar Palm Wrist 2.0 ?2.2 21 ?12 Ulnar Palm - Wrist 8          Median Palm - Ulnar Palm  1.4 ?0.4  L Median - Orthodromic (Dig II, Mid palm)     Dig II Wrist 4.1 ?3.4 10 ?10 Dig II - Wrist 13    R Median - Orthodromic (Dig II, Mid palm)     Dig II Wrist 4.5 ?3.4 3 ?10 Dig II - Wrist 13    L Ulnar -  Orthodromic, (Dig V, Mid palm)     Dig V Wrist 2.9 ?3.1 7 ?5 Dig V - Wrist 11    R Ulnar - Orthodromic, (Dig V, Mid palm)     Dig V Wrist 2.9 ?3.1 9 ?5 Dig V - Wrist 23                   F  Wave    Nerve F Lat Ref.   ms ms  L Ulnar - ADM 29.4 ?32.0  R Ulnar - ADM 29.4 ?32.0         EMG Summary Table    Spontaneous MUAP Recruitment  Muscle IA Fib PSW Fasc Other Amp Dur. Poly Pattern  R. Deltoid Normal None None None _______ Normal Normal Normal Normal  R. Triceps brachii Normal None None None _______ Normal Normal Normal Normal  R. Pronator teres Normal None None None _______ Normal Normal Normal Normal  R. First dorsal interosseous Normal None None None _______ Normal Normal Normal Normal  R. Opponens pollicis Normal None None None _______ Normal Normal Normal Normal

## 2021-08-31 NOTE — Progress Notes (Signed)
Bilateral carpal tunnel syndrome. See procedure note

## 2021-08-31 NOTE — Progress Notes (Signed)
Full Name: Ewel Lona Balash, DDS Gender: Male MRN #: [id number] Date of Birth: 1966/01/19    Visit Date: 08/28/2021 15:52 Age: 55 Years Examining Physician: Naomie Dean, MD  Referring Physician: Naomie Dean, MD  History: hand numbness right >> left  Summary: Nerve Conduction Studies were performed on the bilateral upper extremities.  The right median APB motor nerve showed prolonged distal onset latency (4.9 ms, N<4.4).   The left  median APB motor nerve showed borderline distal onset latency (4.4 ms, N<4.4).  The right Median 2nd Digit orthodromic sensory nerve showed prolonged distal peak latency 4.5 ms, N<3.4) and reduced amplitude(3 uV, N>10)  The left  Median 2nd Digit orthodromic sensory nerve showed prolonged distal peak latency 4.1 ms, N<3.4) and borderline amplitude(10 uV, N>10)  The right median/ulnar (palm) comparison nerve showed prolonged distal peak latency (Median Palm, 3.4 ms, N<2.2) and abnormal peak latency difference (Median Palm-Ulnar Palm, 1.4 ms, N<0.4) with a relative median delay.    The left median/ulnar (palm) comparison nerve showed prolonged distal peak latency (Median Palm, 2.8 ms, N<2.2) and abnormal peak latency difference (Median Palm-Ulnar Palm, 0.7 ms, N<0.4) with a relative median delay.    All remaining nerves (as indicated in the following tables) were within normal limits.  All muscles (as indicated in the following tables) were within normal limits.     Conclusion:  There is electrophysiologic evidence of right moderately-severe Carpal Tunnel Syndrome.  There is electrophysiologic evidence of left mild to moderate Carpal Tunnel Syndrome.   No suggestion of polyneuropathy or radiculopathy.  Patient is requesting surgical release, he is a Education officer, community in the area. 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 Orders Placed This Encounter  Procedures   Ambulatory referral to Orthopedic Surgery    ------------------------------- Naomie Dean,  M.D.  [facility] [address] [place], [place] [address] Tel: [phone] Fax: [phone]  Verbal informed consent was obtained from the patient, patient was informed of potential risk of procedure, including bruising, bleeding, hematoma formation, infection, muscle weakness, muscle pain, numbness, among others.        MNC    Nerve / Sites Muscle Latency Ref. Amplitude Ref. Rel Amp Segments Distance Velocity Ref. Area    ms ms mV mV %  cm m/s m/s mVms  L Median - APB     Wrist APB 4.4 ?4.4 5.4 ?4.0 100 Wrist - APB 7   21.8     Upper arm APB 8.6  5.0  92.4 Upper arm - Wrist 21 49 ?49 21.5  R Median - APB     Wrist APB 4.9 ?4.4 7.8 ?4.0 100 Wrist - APB 7   28.8     Upper arm APB 9.0  8.6  110 Upper arm - Wrist 22 54 ?49 31.0  L Ulnar - ADM     Wrist ADM 2.9 ?3.3 10.3 ?6.0 100 Wrist - ADM 7   35.4     B.Elbow ADM 6.9  8.1  78 B.Elbow - Wrist 21 53 ?49 27.9     A.Elbow ADM 8.8  8.4  104 A.Elbow - B.Elbow 10 52 ?49 30.0  R Ulnar - ADM     Wrist ADM 2.8 ?3.3 8.9 ?6.0 100 Wrist - ADM 7   26.4     B.Elbow ADM 5.9  6.9  77.3 B.Elbow - Wrist 20 64 ?49 20.6     A.Elbow ADM 7.5  8.2  119 A.Elbow - B.Elbow 10 64 ?49 25.6  SNC    Nerve / Sites Rec. Site Peak Lat Ref.  Amp Ref. Segments Distance Peak Diff Ref.    ms ms V V  cm ms ms  L Median, Ulnar - Transcarpal comparison     Median Palm Wrist 2.8 ?2.2 47 ?35 Median Palm - Wrist 8       Ulnar Palm Wrist 2.1 ?2.2 22 ?12 Ulnar Palm - Wrist 8          Median Palm - Ulnar Palm  0.7 ?0.4  R Median, Ulnar - Transcarpal comparison     Median Palm Wrist 3.4 ?2.2 11 ?35 Median Palm - Wrist 8       Ulnar Palm Wrist 2.0 ?2.2 21 ?12 Ulnar Palm - Wrist 8          Median Palm - Ulnar Palm  1.4 ?0.4  L Median - Orthodromic (Dig II, Mid palm)     Dig II Wrist 4.1 ?3.4 10 ?10 Dig II - Wrist 13    R Median - Orthodromic (Dig II, Mid palm)     Dig II Wrist 4.5 ?3.4 3 ?10 Dig II - Wrist 13    L Ulnar -  Orthodromic, (Dig V, Mid palm)     Dig V Wrist 2.9 ?3.1 7 ?5 Dig V - Wrist 11    R Ulnar - Orthodromic, (Dig V, Mid palm)     Dig V Wrist 2.9 ?3.1 9 ?5 Dig V - Wrist 23                   F  Wave    Nerve F Lat Ref.   ms ms  L Ulnar - ADM 29.4 ?32.0  R Ulnar - ADM 29.4 ?32.0         EMG Summary Table    Spontaneous MUAP Recruitment  Muscle IA Fib PSW Fasc Other Amp Dur. Poly Pattern  R. Deltoid Normal None None None _______ Normal Normal Normal Normal  R. Triceps brachii Normal None None None _______ Normal Normal Normal Normal  R. Pronator teres Normal None None None _______ Normal Normal Normal Normal  R. First dorsal interosseous Normal None None None _______ Normal Normal Normal Normal  R. Opponens pollicis Normal None None None _______ Normal Normal Normal Normal

## 2021-09-01 ENCOUNTER — Telehealth: Payer: Self-pay | Admitting: Neurology

## 2021-09-01 NOTE — Telephone Encounter (Signed)
Sent urgent to [facility] for Dr. Dominica Severin ph# [phone].

## 2021-09-02 ENCOUNTER — Encounter: Payer: Self-pay | Admitting: Neurology
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
